# Patient Record
Sex: Male | Born: 1995 | Race: White | Hispanic: No | Marital: Single | Smoking: Never smoker
Health system: Southern US, Community
[De-identification: ages and names within clinical notes are randomized; demographics above are authoritative.]

## PROBLEM LIST (undated history)

## (undated) DIAGNOSIS — J45909 Unspecified asthma, uncomplicated: Secondary | ICD-10-CM

## (undated) DIAGNOSIS — T7840XA Allergy, unspecified, initial encounter: Secondary | ICD-10-CM

## (undated) HISTORY — PX: BREAST SURGERY: SHX581

## (undated) HISTORY — DX: Unspecified asthma, uncomplicated: J45.909

## (undated) HISTORY — DX: Allergy, unspecified, initial encounter: T78.40XA

---
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
## 2015-02-08 ENCOUNTER — Ambulatory Visit: Payer: Self-pay

## 2016-08-29 ENCOUNTER — Ambulatory Visit
Admission: RE | Admit: 2016-08-29 | Discharge: 2016-08-29 | Disposition: A | Discharge status: Home / Self Care | Payer: No Typology Code available for payment source | Source: Ambulatory Visit | Attending: Occupational Medicine | Admitting: Occupational Medicine

## 2016-08-29 ENCOUNTER — Other Ambulatory Visit: Payer: Self-pay | Admitting: Occupational Medicine

## 2016-08-29 DIAGNOSIS — Z021 Encounter for pre-employment examination: Secondary | ICD-10-CM

## 2017-08-30 ENCOUNTER — Emergency Department (HOSPITAL_COMMUNITY): Payer: No Typology Code available for payment source

## 2017-08-30 ENCOUNTER — Other Ambulatory Visit: Payer: Self-pay

## 2017-08-30 ENCOUNTER — Encounter (HOSPITAL_COMMUNITY): Payer: Self-pay | Admitting: Emergency Medicine

## 2017-08-30 ENCOUNTER — Emergency Department (HOSPITAL_COMMUNITY)
Admission: EM | Admit: 2017-08-30 | Discharge: 2017-08-30 | Disposition: A | Discharge status: Home / Self Care | Payer: No Typology Code available for payment source | Attending: Emergency Medicine | Admitting: Emergency Medicine

## 2017-08-30 DIAGNOSIS — T07XXXA Unspecified multiple injuries, initial encounter: Secondary | ICD-10-CM

## 2017-08-30 DIAGNOSIS — Z041 Encounter for examination and observation following transport accident: Secondary | ICD-10-CM | POA: Insufficient documentation

## 2017-08-30 DIAGNOSIS — J45909 Unspecified asthma, uncomplicated: Secondary | ICD-10-CM | POA: Insufficient documentation
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
## 2017-08-30 NOTE — ED Notes (Signed)
Pt declined discharged vital signs. Ready to leave. Work in the a.m.
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
## 2017-08-30 NOTE — Discharge Instructions (Addendum)
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
## 2021-07-23 ENCOUNTER — Telehealth: Payer: Self-pay

## 2021-07-23 NOTE — Telephone Encounter (Signed)
Pt has never been seen by Dr Fabian Sharp. Pt states Dr Fabian Sharp IS NOT is PCP. Will remove him from her pt panel. ?

## 2024-02-24 ENCOUNTER — Encounter (HOSPITAL_BASED_OUTPATIENT_CLINIC_OR_DEPARTMENT_OTHER): Payer: Self-pay | Admitting: Emergency Medicine

## 2024-02-24 ENCOUNTER — Emergency Department (HOSPITAL_BASED_OUTPATIENT_CLINIC_OR_DEPARTMENT_OTHER)
Admission: EM | Admit: 2024-02-24 | Discharge: 2024-02-25 | Disposition: A | Discharge status: Home / Self Care | Attending: Emergency Medicine | Admitting: Emergency Medicine

## 2024-02-24 DIAGNOSIS — R319 Hematuria, unspecified: Secondary | ICD-10-CM | POA: Insufficient documentation

## 2024-02-24 DIAGNOSIS — R3129 Other microscopic hematuria: Secondary | ICD-10-CM

## 2024-02-24 DIAGNOSIS — R10A2 Flank pain, left side: Secondary | ICD-10-CM | POA: Insufficient documentation

## 2024-02-24 DIAGNOSIS — M791 Myalgia, unspecified site: Secondary | ICD-10-CM | POA: Insufficient documentation

## 2024-02-24 LAB — COMPREHENSIVE METABOLIC PANEL WITH GFR
ALT: 44 U/L (ref 0–44)
AST: 46 U/L — ABNORMAL HIGH (ref 15–41)
Albumin: 4.3 g/dL (ref 3.5–5.0)
Alkaline Phosphatase: 41 U/L (ref 38–126)
Anion gap: 11 (ref 5–15)
BUN: 40 mg/dL — ABNORMAL HIGH (ref 6–20)
CO2: 25 mmol/L (ref 22–32)
Calcium: 9.7 mg/dL (ref 8.9–10.3)
Chloride: 99 mmol/L (ref 98–111)
Creatinine, Ser: 0.92 mg/dL (ref 0.61–1.24)
GFR, Estimated: >60 mL/min (ref >60)
Glucose, Bld: 97 mg/dL (ref 70–99)
Potassium: 3.9 mmol/L (ref 3.5–5.1)
Sodium: 135 mmol/L (ref 135–145)
Total Bilirubin: 0.4 mg/dL (ref 0.0–1.2)
Total Protein: 7.3 g/dL (ref 6.5–8.1)

## 2024-02-24 LAB — URINALYSIS, ROUTINE W REFLEX MICROSCOPIC
Bacteria, UA: NONE SEEN
Bilirubin Urine: NEGATIVE
Glucose, UA: NEGATIVE mg/dL
Ketones, ur: NEGATIVE mg/dL
Nitrite: NEGATIVE
Protein, ur: 30 mg/dL — AB
RBC / HPF: >50 RBC/hpf (ref 0–5)
Specific Gravity, Urine: 1.02 (ref 1.005–1.030)
pH: 7 (ref 5.0–8.0)

## 2024-02-24 LAB — CBC WITH DIFFERENTIAL/PLATELET
Abs Immature Granulocytes: 0.01 K/uL (ref 0.00–0.07)
Basophils Absolute: 0 K/uL (ref 0.0–0.1)
Basophils Relative: 0 %
Eosinophils Absolute: 0.1 K/uL (ref 0.0–0.5)
Eosinophils Relative: 2 %
HCT: 41.2 % (ref 39.0–52.0)
Hemoglobin: 14.4 g/dL (ref 13.0–17.0)
Immature Granulocytes: 0 %
Lymphocytes Relative: 21 %
Lymphs Abs: 1.5 K/uL (ref 0.7–4.0)
MCH: 31.4 pg (ref 26.0–34.0)
MCHC: 35 g/dL (ref 30.0–36.0)
MCV: 90 fL (ref 80.0–100.0)
Monocytes Absolute: 0.6 K/uL (ref 0.1–1.0)
Monocytes Relative: 8 %
Neutro Abs: 4.8 K/uL (ref 1.7–7.7)
Neutrophils Relative %: 69 %
Platelets: 217 K/uL (ref 150–400)
RBC: 4.58 MIL/uL (ref 4.22–5.81)
RDW: 13.1 % (ref 11.5–15.5)
WBC: 7 K/uL (ref 4.0–10.5)
nRBC: 0 % (ref 0.0–0.2)

## 2024-02-24 LAB — CK: Total CK: 665 U/L — ABNORMAL HIGH (ref 49–397)

## 2024-02-24 NOTE — ED Triage Notes (Signed)
 Dark brown / red bloody urine Notice today changed to red this evening Some pressure in groin

## 2024-02-24 NOTE — ED Provider Notes (Signed)
 [place] EMERGENCY DEPARTMENT AT [facility] Provider Note   CSN: [id number] Arrival date & time: 02/24/24  2126     Patient presents with: Hematuria   Oscar Mclaughlin is a 28 y.o. male presents today for dark brown/red bloody urine.  Patient also reports left-sided flank pain, myalgias, and some pressure in his groin.  Patient denies nausea, vomiting, fever, chills, shortness of breath, chest pain, diarrhea, or any other complaints at this time.  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  Patient is concerned for his liver and kidney function.    Hematuria       Prior to Admission medications   Not on File    Allergies: Penicillins    Review of Systems  Genitourinary:  Positive for flank pain and hematuria.    Updated Vital Signs BP (!) 164/109   Pulse 92   Temp 98.7 F (37.1 C) (Oral)   Resp 18   SpO2 98%   Physical Exam Vitals and nursing note reviewed.  Constitutional:      General: He is not in acute distress.    Appearance: He is well-developed. He is not toxic-appearing.  HENT:     Head: Normocephalic and atraumatic.     Right Ear: External ear normal.     Left Ear: External ear normal.  Eyes:     Extraocular Movements: Extraocular movements intact.     Conjunctiva/sclera: Conjunctivae normal.  Cardiovascular:     Rate and Rhythm: Normal rate and regular rhythm.     Pulses: Normal pulses.     Heart sounds: Normal heart sounds. No murmur heard. Pulmonary:     Effort: Pulmonary effort is normal. No respiratory distress.     Breath sounds: Normal breath sounds.  Abdominal:     General: There is no distension.     Palpations: Abdomen is soft.     Tenderness: There is no abdominal tenderness. There is left CVA tenderness. There is no guarding.  Musculoskeletal:        General: No swelling.     Cervical back: Neck supple.  Skin:    General: Skin is warm  and dry.     Capillary Refill: Capillary refill takes less than 2 seconds.  Neurological:     General: No focal deficit present.     Mental Status: He is alert and oriented to person, place, and time.  Psychiatric:        Mood and Affect: Mood normal.     (all labs ordered are listed, but only abnormal results are displayed) Labs Reviewed  URINALYSIS, ROUTINE W REFLEX MICROSCOPIC - Abnormal; Notable for the following components:      Result Value   Color, Urine ORANGE (*)    APPearance CLOUDY (*)    Hgb urine dipstick LARGE (*)    Protein, ur 30 (*)    Leukocytes,Ua TRACE (*)    All other components within normal limits  COMPREHENSIVE METABOLIC PANEL WITH GFR - Abnormal; Notable for the following components:   BUN 40 (*)    AST 46 (*)    All other components within normal limits  CK - Abnormal; Notable for the following components:   Total CK 665 (*)    All other components within normal limits  CBC WITH DIFFERENTIAL/PLATELET    EKG: None  Radiology: No results found.   Procedures   Medications Ordered in the ED - No  data to display                                  Medical Decision Making Amount and/or Complexity of Data Reviewed Labs: ordered.   This patient presents to the ED for concern of hematuria flank pain differential diagnosis includes kidney stone, hepatitis, UTI, pyelonephritis, septic stone, rhabdomyolysis    Additional history obtained   Additional history obtained from Electronic Medical Record External records from outside source obtained and reviewed including Care Everywhere   Lab Tests:  I Ordered, and personally interpreted labs.  The pertinent results include: UA with large hemoglobin, trace leukocytes, 30 protein, greater than 50 RBCs, 21-50 WBCs, CBC unremarkable, elevated bun at 40, mildly elevated AST at 46, elevated CK at 665   Imaging Studies ordered:  I ordered imaging studies including CT renal stone study  I  independently visualized and interpreted imaging which showed pending I agree with the radiologist interpretation  Patient signed out to Dr. Trine pending CT renal stone study which will determine patient disposition.  Please refer to their note for full ED course.        Final diagnoses:  None    ED Discharge Orders     None          Francis Ileana LOISE DEVONNA 02/24/24 2358    Jerrol Agent, MD 02/25/24 [phone]

## 2024-02-25 ENCOUNTER — Emergency Department (HOSPITAL_BASED_OUTPATIENT_CLINIC_OR_DEPARTMENT_OTHER)

## 2024-02-25 NOTE — ED Provider Notes (Signed)
 I assumed care of this patient from previous provider.  Please see their note for further details of history, exam, and MDM.   Briefly patient is a 28 y.o. male who presented hematuria pending CT stone study. CT scan negative for renal stones. Recommended continued hydration.  Close follow-up with PCP to recheck UA 1 to 2 weeks.   The patient appears reasonably screened and/or stabilized for discharge and I doubt any other medical condition or other [facility] requiring further screening, evaluation, or treatment in the ED at this time. I have discussed the findings, Dx and Tx plan with the patient/family who expressed understanding and agree(s) with the plan. Discharge instructions discussed at length. The patient/family was given strict return precautions who verbalized understanding of the instructions. No further questions at time of discharge.  Disposition: Discharge  Condition: Good  ED Discharge Orders     None         Follow Up: Primary care provider  Call  to schedule an appointment for close follow up         Lizmary Nader, Raynell Moder, MD 02/25/24 [phone]
